# Patient Record
Sex: Female | Born: 1990 | Race: White | Hispanic: No | Marital: Single | Smoking: Never smoker
Health system: Southern US, Community
[De-identification: ages and names within clinical notes are randomized; demographics above are authoritative.]

## PROBLEM LIST (undated history)

## (undated) DIAGNOSIS — N200 Calculus of kidney: Secondary | ICD-10-CM

## (undated) DIAGNOSIS — G43909 Migraine, unspecified, not intractable, without status migrainosus: Secondary | ICD-10-CM

## (undated) DIAGNOSIS — I10 Essential (primary) hypertension: Secondary | ICD-10-CM

## (undated) HISTORY — PX: CHOLECYSTECTOMY: SHX55

## (undated) HISTORY — PX: HERNIA REPAIR: SHX51

## (undated) HISTORY — PX: TONSILLECTOMY: SUR1361

---

## 2018-04-17 ENCOUNTER — Other Ambulatory Visit: Payer: Self-pay

## 2018-04-17 ENCOUNTER — Emergency Department: Payer: Medicaid Other

## 2018-04-17 ENCOUNTER — Encounter: Payer: Self-pay | Admitting: Emergency Medicine

## 2018-04-17 ENCOUNTER — Emergency Department
Admission: EM | Admit: 2018-04-17 | Discharge: 2018-04-17 | Disposition: A | Discharge status: Home / Self Care | Payer: Medicaid Other | Attending: Emergency Medicine | Admitting: Emergency Medicine

## 2018-04-17 DIAGNOSIS — G43009 Migraine without aura, not intractable, without status migrainosus: Secondary | ICD-10-CM | POA: Diagnosis not present

## 2018-04-17 DIAGNOSIS — R51 Headache: Secondary | ICD-10-CM | POA: Diagnosis present

## 2018-04-17 HISTORY — DX: Migraine, unspecified, not intractable, without status migrainosus: G43.909

## 2018-04-17 HISTORY — DX: Calculus of kidney: N20.0

## 2018-04-17 LAB — POCT PREGNANCY, URINE: PREG TEST UR: NEGATIVE

## 2018-04-17 MED ORDER — KETOROLAC TROMETHAMINE 30 MG/ML IJ SOLN
30.0000 mg | Freq: Once | INTRAMUSCULAR | Status: AC
  Administered 2018-04-17: 30 mg via INTRAVENOUS
  Filled 2018-04-17: qty 1

## 2018-04-17 MED ORDER — SODIUM CHLORIDE 0.9 % IV BOLUS
1000.0000 mL | Freq: Once | INTRAVENOUS | Status: AC
  Administered 2018-04-17: 1000 mL via INTRAVENOUS

## 2018-04-17 MED ORDER — ONDANSETRON 4 MG PO TBDP
4.0000 mg | ORAL_TABLET | Freq: Once | ORAL | Status: AC
  Administered 2018-04-17: 4 mg via ORAL
  Filled 2018-04-17: qty 1

## 2018-04-17 MED ORDER — DIPHENHYDRAMINE HCL 50 MG/ML IJ SOLN
25.0000 mg | Freq: Once | INTRAMUSCULAR | Status: AC
  Administered 2018-04-17: 25 mg via INTRAVENOUS
  Filled 2018-04-17: qty 1

## 2018-04-17 MED ORDER — METOCLOPRAMIDE HCL 5 MG/ML IJ SOLN
10.0000 mg | Freq: Once | INTRAMUSCULAR | Status: AC
  Administered 2018-04-17: 10 mg via INTRAVENOUS
  Filled 2018-04-17: qty 2

## 2018-04-17 NOTE — ED Notes (Signed)
See triage note  Presents with migraine   States she has hx of same  Has been out of meds for same  Has had some n/v this am

## 2018-04-17 NOTE — ED Provider Notes (Signed)
[facility] Emergency Department Provider Note  ____________________________________________   First MD Initiated Contact with Patient 04/17/18 0935     (approximate)  I have reviewed the triage vital signs and the nursing notes.   HISTORY  Chief Complaint Migraine   HPI Kayla Cross is a 27 y.o. female presents to the ED with complaint of "migraine headache".  Patient states that she woke at 2 AM with a headache that was mostly frontal and posterior.  Patient states that this is similar to headaches that she has had in the past.  She complains of nausea with some vomiting.  She is taking an some over-the-counter medication without any relief of her headache.  She denies any visual changes or trauma to her head.  Patient states that she "looked in the mirror to see if she was having a stroke".  Patient has never been evaluated for migraines in the past.  Currently she rates her pain as 6 out of 10.  Past Medical History:  Diagnosis Date  . Kidney stones   . Migraine     There are no active problems to display for this patient.   Past Surgical History:  Procedure Laterality Date  . CHOLECYSTECTOMY    . HERNIA REPAIR    . TONSILLECTOMY      Prior to Admission medications   Not on File    Allergies Patient has no known allergies.  No family history on file.  Social History Social History   Tobacco Use  . Smoking status: Never Smoker  . Smokeless tobacco: Never Used  Substance Use Topics  . Alcohol use: Not on file  . Drug use: Not on file    Review of Systems Constitutional: No fever/chills Eyes: No visual changes. ENT: No sore throat. Cardiovascular: Denies chest pain. Respiratory: Denies shortness of breath. Gastrointestinal: No abdominal pain.  No nausea, no vomiting.  Genitourinary: Negative for dysuria. Musculoskeletal: Negative for back pain. Skin: Negative for rash. Neurological: Positive for headaches, negative for focal  weakness or numbness. ____________________________________________   PHYSICAL EXAM:  VITAL SIGNS: ED Triage Vitals  Enc Vitals Group     BP 04/17/18 0909 (!) 155/119     Pulse Rate 04/17/18 0909 88     Resp 04/17/18 0909 20     Temp 04/17/18 0909 98.4 F (36.9 C)     Temp Source 04/17/18 0909 Oral     SpO2 04/17/18 0909 100 %     Weight 04/17/18 0831 280 lb (127 kg)     Height 04/17/18 0910 5\' 4"  (1.626 m)     Head Circumference --      Peak Flow --      Pain Score 04/17/18 0830 6     Pain Loc --      Pain Edu? --      Excl. in GC? --    Constitutional: Alert and oriented. Well appearing and in no acute distress. Eyes: Conjunctivae are normal. PERRL. EOMI. minimal photophobia. Head: Atraumatic. Nose: No congestion/rhinnorhea.  Mouth/Throat: Mucous membranes are moist.  Oropharynx non-erythematous. Neck: No stridor.  No cervical tenderness on palpation of cervical spine posteriorly.  Range of motion is that restriction in all 4 planes. Hematological/Lymphatic/Immunilogical: No cervical lymphadenopathy. Cardiovascular: Normal rate, regular rhythm. Grossly normal heart sounds.  Good peripheral circulation. Respiratory: Normal respiratory effort.  No retractions. Lungs CTAB. Musculoskeletal: Moves upper and lower extremities without any difficulty and normal gait was noted.  Muscle strength bilaterally. Neurologic:  Normal speech and  language. No gross focal neurologic deficits are appreciated.  Cranial nerves II through XII grossly intact.  No gait instability. Skin:  Skin is warm, dry and intact. No rash noted. Psychiatric: Mood and affect are normal. Speech and behavior are normal.  ____________________________________________   LABS (all labs ordered are listed, but only abnormal results are displayed)  Labs Reviewed  POCT PREGNANCY, URINE  POC URINE PREG, ED   ____________________________________________  RADIOLOGY  Official radiology report(s): Ct Head Wo  Contrast  Result Date: 04/17/2018 CLINICAL DATA:  Headache for several hours EXAM: CT HEAD WITHOUT CONTRAST TECHNIQUE: Contiguous axial images were obtained from the base of the skull through the vertex without intravenous contrast. COMPARISON:  None. FINDINGS: Brain: The ventricles are normal in size and configuration. There is no intracranial mass, hemorrhage, extra-axial fluid collection, or midline shift. The brain parenchyma appears unremarkable. There is no evident acute infarct. Vascular: There is no hyperdense vessel. There is no evident vascular calcification. Skull: The bony calvarium appears intact. Sinuses/Orbits: Visualized paranasal sinuses are clear. Visualized orbits appear symmetric bilaterally. Other: Mastoid air cells are clear. IMPRESSION: Study within normal limits. Electronically Signed   By: Bretta Bang III M.D.   On: 04/17/2018 10:41    ____________________________________________   PROCEDURES  Procedure(s) performed: None  Procedures  Critical Care performed: No  ____________________________________________   INITIAL IMPRESSION / ASSESSMENT AND PLAN / ED COURSE  As part of my medical decision making, I reviewed the following data within the electronic MEDICAL RECORD NUMBER Notes from prior ED visits and [place] Controlled Substance Database  Patient presents to the ED with complaint of acute onset of frontal and posterior headache at 2 AM.  Patient describes the pain as "typical for her headaches.  She states she has not been seen or worked up for migraine headaches in the past.  Currently she is nauseous but with no vomiting.  She denies any injury to her head.  Neurologically patient is intact and CT was reassuring that there was no hemorrhage or tumor causing her headache.  Patient was given IV fluids along with Toradol 30 mg, Benadryl 25 mg, and Reglan IV with complete resolution of her headache.  Patient was discharged when family member  arrived. ____________________________________________   FINAL CLINICAL IMPRESSION(S) / ED DIAGNOSES  Final diagnoses:  Migraine without aura and without status migrainosus, not intractable     ED Discharge Orders    None       Note:  This document was prepared using Dragon voice recognition software and may include unintentional dictation errors.    Tommi Rumps, PA-C 04/17/18 1327    Arnaldo Natal, MD 04/17/18 [phone]

## 2018-04-17 NOTE — ED Triage Notes (Signed)
C/O headache since 0200.  Describes pain as typical migraine pain.  AAOx3.  Skin warm and dry.  MAE equally and strong.  NAD

## 2018-04-17 NOTE — Discharge Instructions (Addendum)
Call 1 the clinics listed on your discharge papers and establish a primary care provider.  Increase fluids today.  Continue with Tylenol or ibuprofen as needed for discomfort. Return to the emergency department if any severe worsening of your symptoms. Do not drive or operate machinery today as you have had medication that

## 2018-05-05 ENCOUNTER — Encounter: Payer: Self-pay | Admitting: Gynecology

## 2018-05-05 ENCOUNTER — Ambulatory Visit
Admission: EM | Admit: 2018-05-05 | Discharge: 2018-05-05 | Disposition: A | Discharge status: Home / Self Care | Payer: Medicaid Other | Attending: Family Medicine | Admitting: Family Medicine

## 2018-05-05 ENCOUNTER — Other Ambulatory Visit: Payer: Self-pay

## 2018-05-05 DIAGNOSIS — M545 Low back pain: Secondary | ICD-10-CM | POA: Insufficient documentation

## 2018-05-05 DIAGNOSIS — M5489 Other dorsalgia: Secondary | ICD-10-CM | POA: Diagnosis not present

## 2018-05-05 DIAGNOSIS — M549 Dorsalgia, unspecified: Secondary | ICD-10-CM

## 2018-05-05 DIAGNOSIS — N12 Tubulo-interstitial nephritis, not specified as acute or chronic: Secondary | ICD-10-CM | POA: Insufficient documentation

## 2018-05-05 HISTORY — DX: Essential (primary) hypertension: I10

## 2018-05-05 LAB — URINALYSIS, COMPLETE (UACMP) WITH MICROSCOPIC
Bilirubin Urine: NEGATIVE
GLUCOSE, UA: NEGATIVE mg/dL
KETONES UR: NEGATIVE mg/dL
NITRITE: NEGATIVE
PH: 6.5 (ref 5.0–8.0)
SPECIFIC GRAVITY, URINE: 1.025 (ref 1.005–1.030)

## 2018-05-05 MED ORDER — CEFTRIAXONE SODIUM 1 G IJ SOLR
1.0000 g | Freq: Once | INTRAMUSCULAR | Status: AC
  Administered 2018-05-05: 1 g via INTRAMUSCULAR

## 2018-05-05 MED ORDER — CEPHALEXIN 500 MG PO CAPS: 500.0000 mg | ORAL_CAPSULE | Freq: Two times a day (BID) | ORAL | 0 refills | Status: AC

## 2018-05-05 NOTE — ED Provider Notes (Signed)
[facility] ____________________________________________  Time seen: Approximately 3:06 PM  I have reviewed the triage vital signs and the nursing notes.   HISTORY  Chief Complaint No chief complaint on file.   HPI Kayla GoingKatie Cross is a 27 y.o. female presented for evaluation of low back pain present for the last 1 week.  Patient reports she has a history of recurrent low back pain also has a history of recurrent UTIs prompting her to come in.  States pain is worse with movement but also present at rest.  Denies any trauma.  States pain is worse in the left low back, and further expresses concern of previous pyelonephritis with similar sensation.  Patient also states that she has had previous kidney stones but none in the last 8 years and states current pain does not feel like previous stones.  States pain is currently moderate to left lower back.  States occasionally she felt like some pain wrapped around to her left abdomen, but not constant.  Patient states that she does not usually have urinary symptoms with her UTIs, and denies any current urinary frequency, urgency or burning with urination.  Denies accompanying fevers.  No nausea, vomiting or vaginal complaints.  Has continued to eat and drink well.  Denies trauma.  Denies chance of current pregnancy.  No chest pain or shortness of breath.  Reports otherwise doing well denies other complaints.  Patient's last menstrual period was 04/14/2018.IUD. Denies pregnancy   Past Medical History:  Diagnosis Date  . Hypertension    pre-clampsia  . Kidney stones   . Migraine     There are no active problems to display for this patient.   Past Surgical History:  Procedure Laterality Date  . CHOLECYSTECTOMY    . HERNIA REPAIR    . TONSILLECTOMY       No current facility-administered medications for this encounter.   Current Outpatient Medications:  .  cephALEXin (KEFLEX) 500 MG capsule, Take 1 capsule (500 mg total) by  mouth 2 (two) times daily for 10 days., Disp: 20 capsule, Rfl: 0  Allergies Patient has no known allergies.  Family History  Problem Relation Age of Onset  . Autoimmune disease Mother   . Hypertension Father     Social History Social History   Tobacco Use  . Smoking status: Never Smoker  . Smokeless tobacco: Never Used  Substance Use Topics  . Alcohol use: Never    Frequency: Never  . Drug use: Never    Review of Systems Constitutional: No fever Cardiovascular: Denies chest pain. Respiratory: Denies shortness of breath. Gastrointestinal: No nausea, no vomiting.  No diarrhea.   Genitourinary: Negative for dysuria. Musculoskeletal: positive for back pain. Skin: Negative for rash. Neurological: Negative for headaches, focal weakness or numbness.  ____________________________________________   PHYSICAL EXAM:  VITAL SIGNS: ED Triage Vitals  Enc Vitals Group     BP 05/05/18 1401 (!) 151/108     Pulse Rate 05/05/18 1357 76     Resp 05/05/18 1357 18     Temp 05/05/18 1357 98.9 F (37.2 C)     Temp Source 05/05/18 1357 Oral     SpO2 05/05/18 1357 99 %     Weight 05/05/18 1402 285 lb (129.3 kg)     Height --      Head Circumference --      Peak Flow --      Pain Score 05/05/18 1401 4     Pain Loc --  Pain Edu? --      Excl. in GC? --    Vitals:   05/05/18 1357 05/05/18 1401 05/05/18 1402  BP:  (!) 151/108 (!) 137/97  Pulse: 76    Resp: 18    Temp: 98.9 F (37.2 C)    TempSrc: Oral    SpO2: 99%    Weight:   285 lb (129.3 kg)     Constitutional: Alert and oriented. Well appearing and in no acute distress. ENT      Head: Normocephalic and atraumatic. Cardiovascular: Normal rate, regular rhythm. Grossly normal heart sounds.  Good peripheral circulation. Respiratory: Normal respiratory effort without tachypnea nor retractions. Breath sounds are clear and equal bilaterally. No wheezes, rales, rhonchi. Gastrointestinal: Normal Bowel sounds.  Minimal left  lower quadrant abdominal tenderness palpation.  No suprapubic tenderness.  Mild left CVA as well as left latissimus dorsi tenderness.  No right CVA tenderness. Musculoskeletal:  No midline cervical, thoracic or lumbar tenderness to palpation.  Changes positions quickly in room.  Steady gait. Neurologic:  Normal speech and language. Speech is normal. No gait instability.  Skin:  Skin is warm, dry and intact. No rash noted. Psychiatric: Mood and affect are normal. Speech and behavior are normal. Patient exhibits appropriate insight and judgment   ___________________________________________   LABS (all labs ordered are listed, but only abnormal results are displayed)  Labs Reviewed  URINALYSIS, COMPLETE (UACMP) WITH MICROSCOPIC - Abnormal; Notable for the following components:      Result Value   APPearance HAZY (*)    Hgb urine dipstick TRACE (*)    Protein, ur TRACE (*)    Leukocytes, UA TRACE (*)    Bacteria, UA MANY (*)    All other components within normal limits  URINE CULTURE     PROCEDURES Procedures    INITIAL IMPRESSION / ASSESSMENT AND PLAN / ED COURSE  Pertinent labs & imaging results that were available during my care of the patient were reviewed by me and considered in my medical decision making (see chart for details).  Overall well-appearing patient.  Patient with left-sided low back pain.  History of recurrent UTIs with pyelonephritis.  Urinalysis suggestive of UTI.,  Concern for pyelonephritis.  Discussed with patient doubt stone as patient states this does not feel similar to her previous soon as well as trace hemoglobin and negative RBCs.  Patient reports tolerates Keflex well.  1 g IM Rocephin given once in urgent care.  Will treat with 10-day course of Keflex and await urine culture.  Discussed very strict follow-up and return parameters.  Discussed for any increased pain, fever, inability to tolerate medicines or worsening complaints proceed directly to the  emergency room for imaging.  Rest, fluids, supportive care.Discussed indication, risks and benefits of medications with patient.  Discussed follow up with Primary care physician this week. Discussed follow up and return parameters including no resolution or any worsening concerns. Patient verbalized understanding and agreed to plan.   ____________________________________________   FINAL CLINICAL IMPRESSION(S) / ED DIAGNOSES  Final diagnoses:  Pyelonephritis  Acute left-sided back pain, unspecified back location     ED Discharge Orders         Ordered    cephALEXin (KEFLEX) 500 MG capsule  2 times daily     05/05/18 1447           Note: This dictation was prepared with Dragon dictation along with smaller phrase technology. Any transcriptional errors that result from this process are unintentional.  Renford Dills, NP 05/05/18 [phone]

## 2018-05-05 NOTE — ED Triage Notes (Signed)
Patient c/o lower back pain x week

## 2018-05-05 NOTE — Discharge Instructions (Addendum)
Take medication as prescribed. Rest. Drink plenty of fluids. Over the counter tylenol and ibuprofen as needed.   Follow up with your primary care physician this week as needed. Return to Urgent care as needed.  For any increased pain, fever or worsening concerns proceed directly to the emergency room.

## 2018-05-06 LAB — URINE CULTURE

## 2018-06-02 ENCOUNTER — Other Ambulatory Visit: Payer: Self-pay

## 2018-06-02 ENCOUNTER — Encounter: Payer: Self-pay | Admitting: Gynecology

## 2018-06-02 ENCOUNTER — Ambulatory Visit
Admission: EM | Admit: 2018-06-02 | Discharge: 2018-06-02 | Disposition: A | Discharge status: Home / Self Care | Payer: Medicaid Other | Attending: Family Medicine | Admitting: Family Medicine

## 2018-06-02 DIAGNOSIS — R399 Unspecified symptoms and signs involving the genitourinary system: Secondary | ICD-10-CM | POA: Insufficient documentation

## 2018-06-02 LAB — URINALYSIS, COMPLETE (UACMP) WITH MICROSCOPIC
Bilirubin Urine: NEGATIVE
GLUCOSE, UA: NEGATIVE mg/dL
Ketones, ur: NEGATIVE mg/dL
Leukocytes, UA: NEGATIVE
Nitrite: POSITIVE — AB
PROTEIN: 100 mg/dL — AB
Specific Gravity, Urine: 1.02 (ref 1.005–1.030)
pH: 7 (ref 5.0–8.0)

## 2018-06-02 MED ORDER — NITROFURANTOIN MONOHYD MACRO 100 MG PO CAPS: 100.0000 mg | ORAL_CAPSULE | Freq: Two times a day (BID) | ORAL | 0 refills | Status: DC

## 2018-06-02 MED ORDER — FLUCONAZOLE 150 MG PO TABS: 150.0000 mg | ORAL_TABLET | Freq: Every day | ORAL | 0 refills | Status: DC

## 2018-06-02 NOTE — ED Triage Notes (Signed)
Patient c/o frequent urination

## 2018-06-02 NOTE — Discharge Instructions (Signed)
Your urine is suggest of an infection.  I am placing you on antibiotics while awaiting the culture.  Take care  Dr. Adriana Simas

## 2018-06-02 NOTE — ED Provider Notes (Signed)
[facility]    CSN: [id number] Arrival date & time: 06/02/18  1007   History   Chief Complaint Concern for UTI  HPI  28 year old female with reported history of recurrent/frequent UTIs presents with urinary symptoms.  Patient was seen on 12/8 for urinary symptoms.  She was treated empirically.  Urine culture grew out multiple bacteria.  Patient states that her back pain resolved after treatment but her urinary symptoms have persisted.  Patient reports urinary frequency and feelings as if she is not emptying her bladder fully.  She reports some lower abdominal pain/pelvic pain.  Denies dysuria.  No fever.  No current back pain or flank pain.  No other reported symptoms.  No other complaints.  PMH, Surgical Hx, Family Hx, Social History reviewed and updated as below.  Past Medical History:  Diagnosis Date  . Hypertension    pre-clampsia  . Kidney stones   . Migraine    Past Surgical History:  Procedure Laterality Date  . CHOLECYSTECTOMY    . HERNIA REPAIR    . TONSILLECTOMY      OB History   No obstetric history on file.      Home Medications    Prior to Admission medications   Medication Sig Start Date End Date Taking? Authorizing Provider  nitrofurantoin, macrocrystal-monohydrate, (MACROBID) 100 MG capsule Take 1 capsule (100 mg total) by mouth 2 (two) times daily. 06/02/18   Tommie Sams, DO    Family History Family History  Problem Relation Age of Onset  . Autoimmune disease Mother   . Hypertension Father     Social History Social History   Tobacco Use  . Smoking status: Never Smoker  . Smokeless tobacco: Never Used  Substance Use Topics  . Alcohol use: Never    Frequency: Never  . Drug use: Never     Allergies   Patient has no known allergies.   Review of Systems Review of Systems  Constitutional: Negative for fever.  Genitourinary: Positive for frequency. Negative for dysuria and flank pain.   Physical Exam Triage Vital  Signs ED Triage Vitals  Enc Vitals Group     BP 06/02/18 1035 (!) 143/94     Pulse Rate 06/02/18 1035 78     Resp 06/02/18 1035 18     Temp 06/02/18 1035 97.9 F (36.6 C)     Temp Source 06/02/18 1035 Oral     SpO2 06/02/18 1035 99 %     Weight 06/02/18 1037 300 lb (136.1 kg)     Height 06/02/18 1037 5\' 4"  (1.626 m)     Head Circumference --      Peak Flow --      Pain Score 06/02/18 1037 0     Pain Loc --      Pain Edu? --      Excl. in GC? --    Updated Vital Signs BP (!) 143/94 (BP Location: Left Arm)   Pulse 78   Temp 97.9 F (36.6 C) (Oral)   Resp 18   Ht 5\' 4"  (1.626 m)   Wt 136.1 kg   LMP 05/12/2018   SpO2 99%   BMI 51.49 kg/m   Visual Acuity Right Eye Distance:   Left Eye Distance:   Bilateral Distance:    Right Eye Near:   Left Eye Near:    Bilateral Near:     Physical Exam Vitals signs and nursing note reviewed.  Constitutional:      General: She is not  in acute distress.    Appearance: She is obese.  HENT:     Head: Normocephalic and atraumatic.  Cardiovascular:     Rate and Rhythm: Normal rate and regular rhythm.  Pulmonary:     Effort: Pulmonary effort is normal. No respiratory distress.  Abdominal:     General: There is no distension.     Palpations: Abdomen is soft.     Tenderness: There is no abdominal tenderness.  Neurological:     Mental Status: She is alert.  Psychiatric:        Mood and Affect: Mood normal.        Behavior: Behavior normal.    UC Treatments / Results  Labs (all labs ordered are listed, but only abnormal results are displayed) Labs Reviewed  URINALYSIS, COMPLETE (UACMP) WITH MICROSCOPIC - Abnormal; Notable for the following components:      Result Value   APPearance HAZY (*)    Hgb urine dipstick TRACE (*)    Protein, ur 100 (*)    Nitrite POSITIVE (*)    Bacteria, UA MANY (*)    All other components within normal limits  URINE CULTURE    EKG None  Radiology No results  found.  Procedures Procedures (including critical care time)  Medications Ordered in UC Medications - No data to display  Initial Impression / Assessment and Plan / UC Course  I have reviewed the triage vital signs and the nursing notes.  Pertinent labs & imaging results that were available during my care of the patient were reviewed by me and considered in my medical decision making (see chart for details).    28 year old female presents with urinary symptoms.  Analysis suggestive of UTI. Sending culture.  Placing on Macrobid while awaiting culture.  Final Clinical Impressions(s) / UC Diagnoses   Final diagnoses:  Urinary tract infection symptoms     Discharge Instructions     Your urine is suggest of an infection.  I am placing you on antibiotics while awaiting the culture.  Take care  Dr. Adriana Simasook    ED Prescriptions    Medication Sig Dispense Auth. Provider   nitrofurantoin, macrocrystal-monohydrate, (MACROBID) 100 MG capsule Take 1 capsule (100 mg total) by mouth 2 (two) times daily. 14 capsule Tommie Samsook, Kamirah Shugrue G, DO     Controlled Substance Prescriptions [place] Controlled Substance Registry consulted? Not Applicable   Tommie SamsCook, Shaketa Serafin G, DO 06/02/18 1126

## 2018-06-05 ENCOUNTER — Telehealth (HOSPITAL_COMMUNITY): Payer: Self-pay | Admitting: Emergency Medicine

## 2018-06-05 LAB — URINE CULTURE

## 2018-06-05 NOTE — Telephone Encounter (Signed)
Urine culture was positive for E coli and was given MACROBID  at urgent care visit. Attempted to reach patient. No answer at this time.

## 2018-12-07 ENCOUNTER — Emergency Department
Admission: EM | Admit: 2018-12-07 | Discharge: 2018-12-07 | Disposition: A | Discharge status: Home / Self Care | Payer: Medicaid Other | Attending: Emergency Medicine | Admitting: Emergency Medicine

## 2018-12-07 ENCOUNTER — Other Ambulatory Visit: Payer: Self-pay

## 2018-12-07 ENCOUNTER — Encounter: Payer: Self-pay | Admitting: Emergency Medicine

## 2018-12-07 DIAGNOSIS — Z79899 Other long term (current) drug therapy: Secondary | ICD-10-CM | POA: Insufficient documentation

## 2018-12-07 DIAGNOSIS — Z9049 Acquired absence of other specified parts of digestive tract: Secondary | ICD-10-CM | POA: Insufficient documentation

## 2018-12-07 DIAGNOSIS — H109 Unspecified conjunctivitis: Secondary | ICD-10-CM

## 2018-12-07 DIAGNOSIS — H1032 Unspecified acute conjunctivitis, left eye: Secondary | ICD-10-CM | POA: Insufficient documentation

## 2018-12-07 MED ORDER — GENTAMICIN SULFATE 0.3 % OP SOLN: 2.0000 [drp] | Freq: Four times a day (QID) | OPHTHALMIC | 0 refills | Status: AC

## 2018-12-07 MED ORDER — FLUORESCEIN SODIUM 1 MG OP STRP
1.0000 | ORAL_STRIP | Freq: Once | OPHTHALMIC | Status: AC
  Administered 2018-12-07: 1 via OPHTHALMIC
  Filled 2018-12-07: qty 1

## 2018-12-07 MED ORDER — TETRACAINE HCL 0.5 % OP SOLN
2.0000 [drp] | Freq: Once | OPHTHALMIC | Status: AC
  Administered 2018-12-07: 2 [drp] via OPHTHALMIC
  Filled 2018-12-07: qty 4

## 2018-12-07 MED ORDER — GENTAMICIN SULFATE 0.3 % OP SOLN: 2.0000 [drp] | Freq: Four times a day (QID) | OPHTHALMIC | 0 refills | Status: DC

## 2018-12-07 NOTE — Discharge Instructions (Signed)
Follow-up with Dr. Neville Route who is on-call for [facility] if any continued problems.  Begin using eyedrops to your left eye 2 drops 4 times a day until your eyes completely better.  Avoid wearing contacts or using eye make-up until this is completely cleared.  You may also take Tylenol if needed for pain.

## 2018-12-07 NOTE — ED Provider Notes (Signed)
[facility] Emergency Department Provider Note  ____________________________________________   First MD Initiated Contact with Patient 12/07/18 1749     (approximate)  I have reviewed the triage vital signs and the nursing notes.   HISTORY  Chief Complaint Eye Problem   HPI Kayla Cross is a 28 y.o. female presents to the ED with complaint of eye irritation to her left eye for the last 4 days.  Patient thinks she might be having a reaction to magnetic lashes that she used 4 days ago.  She states that there is intermittent photophobia and that she has used over-the-counter drops and her contact solution to irrigate her eye without relief.  She rates her pain as a 4 out of 10.     Past Medical History:  Diagnosis Date  . Kidney stones   . Migraine     There are no active problems to display for this patient.   Past Surgical History:  Procedure Laterality Date  . CHOLECYSTECTOMY    . HERNIA REPAIR    . TONSILLECTOMY      Prior to Admission medications   Medication Sig Start Date End Date Taking? Authorizing Provider  fluconazole (DIFLUCAN) 150 MG tablet Take 1 tablet (150 mg total) by mouth daily. Repeat dose in 72 hours. 06/02/18   Tommie Samsook, Jayce G, DO  gentamicin (GARAMYCIN) 0.3 % ophthalmic solution Place 2 drops into the left eye 4 (four) times daily for 10 days. 12/07/18 12/17/18  Tommi RumpsSummers,  L, PA-C  nitrofurantoin, macrocrystal-monohydrate, (MACROBID) 100 MG capsule Take 1 capsule (100 mg total) by mouth 2 (two) times daily. 06/02/18   Tommie Samsook, Jayce G, DO    Allergies Patient has no known allergies.  Family History  Problem Relation Age of Onset  . Autoimmune disease Mother   . Hypertension Father     Social History Social History   Tobacco Use  . Smoking status: Never Smoker  . Smokeless tobacco: Never Used  Substance Use Topics  . Alcohol use: Never    Frequency: Never  . Drug use: Never    Review of Systems Constitutional: No  fever/chills Eyes: Left eye pain. Cardiovascular: Denies chest pain. Respiratory: Denies shortness of breath. Musculoskeletal: Negative for muscle aches. Skin: Negative for rash. Neurological: Negative for headaches  ___________________________________________   PHYSICAL EXAM:  VITAL SIGNS: ED Triage Vitals  Enc Vitals Group     BP 12/07/18 1722 (!) 162/100     Pulse Rate 12/07/18 1721 85     Resp 12/07/18 1721 18     Temp 12/07/18 1721 97.9 F (36.6 C)     Temp Source 12/07/18 1721 Oral     SpO2 12/07/18 1721 99 %     Weight 12/07/18 1721 295 lb (133.8 kg)     Height 12/07/18 1721 5\' 4"  (1.626 m)     Head Circumference --      Peak Flow --      Pain Score 12/07/18 1721 4     Pain Loc --      Pain Edu? --      Excl. in GC? --     Constitutional: Alert and oriented. Well appearing and in no acute distress. Eyes: Conjunctivae are normal on the right.  Left conjunctive is injected without exudate.  PERRL. EOMI. tetracaine was placed in the left eye.  Upper lid was everted and no foreign body was noted.  Fluorescein was placed in the eye and there was no corneal abrasions seen. Head: Atraumatic. Nose:  No congestion/rhinnorhea. Neck: No stridor.   Cardiovascular: Normal rate, regular rhythm. Grossly normal heart sounds.  Good peripheral circulation. Respiratory: Normal respiratory effort.  No retractions. Lungs CTAB. Musculoskeletal: Moves upper and lower extremities that any difficulty.  Normal gait was noted. Neurologic:  Normal speech and language. No gross focal neurologic deficits are appreciated. No gait instability. Skin:  Skin is warm, dry and intact. No rash noted. Psychiatric: Mood and affect are normal. Speech and behavior are normal.  ____________________________________________   LABS (all labs ordered are listed, but only abnormal results are displayed)  Labs Reviewed - No data to display   PROCEDURES  Procedure(s) performed (including Critical Care):   Procedures   ____________________________________________   INITIAL IMPRESSION / ASSESSMENT AND PLAN / ED COURSE  As part of my medical decision making, I reviewed the following data within the electronic MEDICAL RECORD NUMBER Notes from prior ED visits and [place] Controlled Substance Database  28 year old female presents to the ED with complaint of left eye irritation after using a set of magnetic lashes 4 days ago.  Patient has been using over-the-counter eyedrops and contact solution without any relief.  Visual acuity was noted.  Patient exam was negative for corneal abrasion but irritation to the conjunctivo-is noted.  Patient was discharged with prescription for gentamicin ophthalmic solution.  She is to also follow-up with [facility] if any continued problems. ____________________________________________   FINAL CLINICAL IMPRESSION(S) / ED DIAGNOSES  Final diagnoses:  Conjunctivitis of left eye, unspecified conjunctivitis type     ED Discharge Orders         Ordered    gentamicin (GARAMYCIN) 0.3 % ophthalmic solution  4 times daily,   Status:  Discontinued     12/07/18 1806    gentamicin (GARAMYCIN) 0.3 % ophthalmic solution  4 times daily     12/07/18 1905           Note:  This document was prepared using Dragon voice recognition software and may include unintentional dictation errors.    Johnn Hai, PA-C 12/07/18 2005    Earleen Newport, MD 12/07/18 2041

## 2018-12-07 NOTE — ED Triage Notes (Signed)
Pt here for pain/redness to left eye. Thinks having reaction to magnetic lashes.

## 2019-08-27 ENCOUNTER — Encounter: Payer: Self-pay | Admitting: Advanced Practice Midwife

## 2019-08-27 ENCOUNTER — Ambulatory Visit (LOCAL_COMMUNITY_HEALTH_CENTER): Payer: Self-pay | Admitting: Advanced Practice Midwife

## 2019-08-27 ENCOUNTER — Other Ambulatory Visit: Payer: Self-pay

## 2019-08-27 VITALS — BP 158/101 | Ht 65.0 in | Wt 320.8 lb

## 2019-08-27 DIAGNOSIS — E669 Obesity, unspecified: Secondary | ICD-10-CM | POA: Insufficient documentation

## 2019-08-27 DIAGNOSIS — Z30432 Encounter for removal of intrauterine contraceptive device: Secondary | ICD-10-CM

## 2019-08-27 DIAGNOSIS — Z3009 Encounter for other general counseling and advice on contraception: Secondary | ICD-10-CM

## 2019-08-27 NOTE — Progress Notes (Signed)
   [facility]  Subjective:  Kayla Cross is a 29 y.o. SWF nonsmoker G4P4 being seen today for IUD removal, pap, hx preeclampsia, swollen feet intermittently x 1 mo with sore swollen calves since yesterday  Chief Complaint  Patient presents with  . Procedure    IUD removal    HPI  LMP 08/08/19.  Last sex 08/21/19 with condom.  Liletta inserted pp 08/2017 in [place], [place].  Hx preeclampsia with last pregnancy 2019 with no f/u. 158/101. Repeat 157/104.  Last pap 2014? No record Does the patient have a current or past history of drug use? No   No components found for: HCV]   Health Maintenance Due  Topic Date Due  . HIV Screening  Never done  . TETANUS/TDAP  Never done  . PAP-Cervical Cytology Screening  Never done  . PAP SMEAR-Modifier  Never done  . INFLUENZA VACCINE  Never done    ROS  The following portions of the patient's history were reviewed and updated as appropriate: allergies, current medications, past family history, past medical history, past social history, past surgical history and problem list. Problem list updated.   See flowsheet for other program required questions.  Objective:   Vitals:   08/27/19 1546  BP: (!) 158/101  Weight: (!) 320 lb 12.8 oz (145.5 kg)  Height: 5\' 5"  (1.651 m)    Physical Exam Vitals reviewed.  Constitutional:      Appearance: She is obese.  HENT:     Head: Normocephalic.  Abdominal:     Palpations: Abdomen is soft.     Comments: Poor tone, soft without tenderness, increased adipose  Genitourinary:    General: Normal vulva.     Exam position: Lithotomy position.     Vagina: Normal.     Cervix: Friability (to pap and cultures) present.     Rectum: Normal.     Comments: IUD easily removed and shown to pt Skin:    General: Skin is warm and dry.  Neurological:     Mental Status: She is alert.     Calves equal bilaterally with no masses or erythema  palpated. Friable cx to pap  Assessment and Plan:  Kayla Cross is a 29 y.o. female presenting to the [facility] Department for a Women's Health problem visit  1. Obesity, unspecified classification, unspecified obesity type, unspecified whether serious comorbidity present   2. Family planning Please give primary care MD info to pt; referred to primary care MD for elevated BP. Declines birth control today stating partner is getting vasectomy - Chlamydia/Gonorrhea  Lab - IGP, Aptima HPV  3. Encounter for removal of intrauterine contraceptive device (IUD) Liletta easily removed and shown to pt     Return if symptoms worsen or fail to improve.  No future appointments.  [facility], CNM

## 2019-08-27 NOTE — Progress Notes (Addendum)
Here today for IUD removal. No previous PE or Pap Smears at this agency. Patient reports last Pap Smear in 2014 doesn't remember where. Reports IUD inserted in 2019 in [place], [place] after the birth of her son. Tawny Hopping, RN

## 2019-08-29 ENCOUNTER — Emergency Department: Payer: Self-pay

## 2019-08-29 ENCOUNTER — Other Ambulatory Visit: Payer: Self-pay

## 2019-08-29 ENCOUNTER — Emergency Department
Admission: EM | Admit: 2019-08-29 | Discharge: 2019-08-29 | Disposition: A | Discharge status: Home / Self Care | Payer: Self-pay | Attending: Emergency Medicine | Admitting: Emergency Medicine

## 2019-08-29 DIAGNOSIS — R2243 Localized swelling, mass and lump, lower limb, bilateral: Secondary | ICD-10-CM | POA: Insufficient documentation

## 2019-08-29 DIAGNOSIS — I10 Essential (primary) hypertension: Secondary | ICD-10-CM | POA: Insufficient documentation

## 2019-08-29 DIAGNOSIS — R0602 Shortness of breath: Secondary | ICD-10-CM | POA: Insufficient documentation

## 2019-08-29 DIAGNOSIS — M79672 Pain in left foot: Secondary | ICD-10-CM | POA: Insufficient documentation

## 2019-08-29 LAB — COMPREHENSIVE METABOLIC PANEL
ALT: 17 U/L (ref 0–44)
AST: 25 U/L (ref 15–41)
Albumin: 4.1 g/dL (ref 3.5–5.0)
Alkaline Phosphatase: 63 U/L (ref 38–126)
Anion gap: 10 (ref 5–15)
BUN: 13 mg/dL (ref 6–20)
CO2: 20 mmol/L — ABNORMAL LOW (ref 22–32)
Calcium: 9.2 mg/dL (ref 8.9–10.3)
Chloride: 106 mmol/L (ref 98–111)
Creatinine, Ser: 0.7 mg/dL (ref 0.44–1.00)
GFR calc Af Amer: >60 mL/min (ref >60)
GFR calc non Af Amer: >60 mL/min (ref >60)
Glucose, Bld: 100 mg/dL — ABNORMAL HIGH (ref 70–99)
Potassium: 4.9 mmol/L (ref 3.5–5.1)
Sodium: 136 mmol/L (ref 135–145)
Total Bilirubin: 1.2 mg/dL (ref 0.3–1.2)
Total Protein: 8.2 g/dL — ABNORMAL HIGH (ref 6.5–8.1)

## 2019-08-29 LAB — CBC
HCT: 46.4 % — ABNORMAL HIGH (ref 36.0–46.0)
Hemoglobin: 14.6 g/dL (ref 12.0–15.0)
MCH: 30 pg (ref 26.0–34.0)
MCHC: 31.5 g/dL (ref 30.0–36.0)
MCV: 95.3 fL (ref 80.0–100.0)
Platelets: 151 10*3/uL (ref 150–400)
RBC: 4.87 MIL/uL (ref 3.87–5.11)
RDW: 11.8 % (ref 11.5–15.5)
WBC: 8.3 10*3/uL (ref 4.0–10.5)
nRBC: 0 % (ref 0.0–0.2)

## 2019-08-29 LAB — TROPONIN I (HIGH SENSITIVITY): Troponin I (High Sensitivity): 3 ng/L (ref <18)

## 2019-08-29 LAB — LIPASE, BLOOD: Lipase: 24 U/L (ref 11–51)

## 2019-08-29 LAB — BRAIN NATRIURETIC PEPTIDE: B Natriuretic Peptide: 84 pg/mL (ref 0.0–100.0)

## 2019-08-29 MED ORDER — SODIUM CHLORIDE 0.9% FLUSH: 3.0000 mL | Freq: Once | INTRAVENOUS | Status: DC

## 2019-08-29 MED ORDER — HYDROCHLOROTHIAZIDE 25 MG PO TABS: 25.0000 mg | ORAL_TABLET | Freq: Every day | ORAL | 1 refills | Status: AC

## 2019-08-29 NOTE — ED Triage Notes (Signed)
Pt comes via POV from home with c/o headaches and high blood pressure. Pt states the last few days she noticed swelling in calves and feet.  Pt states she has hx of HTN during pregnancy in the past. Pt denies any medications for BP

## 2019-08-29 NOTE — ED Notes (Signed)
Pt states having a high blood pressure for the last few days and swelling in her feet. Pt states she had pre-eclampsia with her last pregnancy, but that she hasn't had issues yet. Denies blurry vision and headaches.

## 2019-08-29 NOTE — Discharge Instructions (Signed)
Take the blood pressure medication as prescribed.  Follow-up with a primary care doctor or clinic.  Return to the ER for new, worsening, or persistent severe elevated blood pressure, swelling, chest pain, shortness of breath, or any other new or worsening symptoms that concern you.

## 2019-08-29 NOTE — ED Notes (Signed)
Pt transported to xray 

## 2019-08-29 NOTE — ED Provider Notes (Signed)
[facility] Emergency Department Provider Note ____________________________________________   First MD Initiated Contact with Patient 08/29/19 1213     (approximate)  I have reviewed the triage vital signs and the nursing notes.   HISTORY  Chief Complaint Leg Swelling and Hypertension    HPI Kayla Cross is a 29 y.o. female with PMH as noted below as well as a history of gestational hypertension who presents with elevated blood pressure, first noted 2 days ago when she was at an appointment getting an IUD removed.  It was noted to be 158/101 at that time.  She states that she was hypertensive during her most recent pregnancy 2 years ago and after the pregnancy had severe hypertension requiring treatment with magnesium.  She was then put on blood pressure medication for about 4 months but when her insurance ended she did not continue.  In addition, the patient reports gradual onset bilateral lower leg swelling over the last few months and has some exertional shortness of breath.  She denies any chest pain.  She also reports some subacute pain to the plantar area of her left foot but has had no trauma.  Past Medical History:  Diagnosis Date  . Kidney stones   . Migraine     Patient Active Problem List   Diagnosis Date Noted  . Obesity BMI=53.3 08/27/2019    Past Surgical History:  Procedure Laterality Date  . CHOLECYSTECTOMY    . HERNIA REPAIR    . TONSILLECTOMY      Prior to Admission medications   Medication Sig Start Date End Date Taking? Authorizing Provider  hydrochlorothiazide (HYDRODIURIL) 25 MG tablet Take 1 tablet (25 mg total) by mouth daily. 08/29/19 10/28/19  Dionne Bucy, MD    Allergies Patient has no known allergies.  Family History  Problem Relation Age of Onset  . Autoimmune disease Mother   . Hypertension Father     Social History Social History   Tobacco Use  . Smoking status: Never Smoker  . Smokeless tobacco:  Never Used  Substance Use Topics  . Alcohol use: Never  . Drug use: Never    Review of Systems  Constitutional: No fever. Eyes: No redness. ENT: No sore throat. Cardiovascular: Denies chest pain. Respiratory: Positive for intermittent shortness of breath. Gastrointestinal: No vomiting or diarrhea.  Genitourinary: Negative for dysuria.  Musculoskeletal: Negative for back pain. Skin: Negative for rash. Neurological: Negative for headaches, focal weakness or numbness.   ____________________________________________   PHYSICAL EXAM:  VITAL SIGNS: ED Triage Vitals [08/29/19 1111]  Enc Vitals Group     BP (!) 164/119     Pulse Rate 86     Resp 19     Temp 98.4 F (36.9 C)     Temp src      SpO2 95 %     Weight (!) 320 lb 12.8 oz (145.5 kg)     Height 5\' 4"  (1.626 m)     Head Circumference      Peak Flow      Pain Score 0     Pain Loc      Pain Edu?      Excl. in GC?     Constitutional: Alert and oriented.  Relatively well appearing and in no acute distress. Eyes: Conjunctivae are normal.  Head: Atraumatic. Nose: No congestion/rhinnorhea. Mouth/Throat: Mucous membranes are moist.   Neck: Normal range of motion.  Cardiovascular: Normal rate, regular rhythm. Grossly normal heart sounds.  Good peripheral circulation. Respiratory:  Normal respiratory effort.  No retractions. Lungs CTAB. Gastrointestinal: No distention.  Musculoskeletal: Trace bilateral lower extremity edema.  Extremities warm and well perfused.  Mild tenderness to the soft tissue of the lateral plantar surface of the left foot. Neurologic:  Normal speech and language. No gross focal neurologic deficits are appreciated.  Skin:  Skin is warm and dry. No rash noted. Psychiatric: Mood and affect are normal. Speech and behavior are normal.  ____________________________________________   LABS (all labs ordered are listed, but only abnormal results are displayed)  Labs Reviewed  COMPREHENSIVE METABOLIC  PANEL - Abnormal; Notable for the following components:      Result Value   CO2 20 (*)    Glucose, Bld 100 (*)    Total Protein 8.2 (*)    All other components within normal limits  CBC - Abnormal; Notable for the following components:   HCT 46.4 (*)    All other components within normal limits  LIPASE, BLOOD  BRAIN NATRIURETIC PEPTIDE  URINALYSIS, COMPLETE (UACMP) WITH MICROSCOPIC  BRAIN NATRIURETIC PEPTIDE  POC URINE PREG, ED  TROPONIN I (HIGH SENSITIVITY)  TROPONIN I (HIGH SENSITIVITY)   ____________________________________________  EKG  ED ECG REPORT I, Dionne Bucy, the attending physician, personally viewed and interpreted this ECG.  Date: 08/29/2019 EKG Time: 1509 Rate: 63 Rhythm: normal sinus rhythm QRS Axis: normal Intervals: normal ST/T Wave abnormalities: normal Narrative Interpretation: no evidence of acute ischemia  ____________________________________________  RADIOLOGY  CXR: No focal infiltrate no focal infiltrate or edema XR L foot: No fracture  ____________________________________________   PROCEDURES  Procedure(s) performed: No  Procedures  Critical Care performed: No ____________________________________________   INITIAL IMPRESSION / ASSESSMENT AND PLAN / ED COURSE  Pertinent labs & imaging results that were available during my care of the patient were reviewed by me and considered in my medical decision making (see chart for details).  29 year old female with PMH as noted above presents with hypertension as well as subacute to chronic bilateral lower leg swelling, mild shortness of breath, and pain to the plantar aspect of her left foot.  She was previously on an antihypertensive but discontinued over a year ago.  On exam she is well-appearing and her vital signs are normal except for hypertension.  She does have trace edema to the lower extremities and mild tenderness to the plantar aspect of the foot, but no other significant  exam findings.  Overall I suspect essential hypertension.  Given the patient's reported swelling and intermittent shortness of breath we will obtain a chest x-ray, troponin, and BNP to rule out new onset CHF or other cardiac etiology.  I suspect plantar fasciitis in the left foot but will obtain x-ray to rule out stress fracture or other bony etiology.  I anticipate discharge home starting the patient on antihypertensive if the work-up is reassuring.  ----------------------------------------- 4:00 PM on 08/29/2019 -----------------------------------------  Chest and foot x-rays are negative.  The additional labs are unremarkable.  The patient's BNP and troponin are both normal.  There is no indication for repeat troponin given the duration of the symptoms.  Her blood pressure remains somewhat elevated although she is asymptomatic at this time.  EKG shows no ischemic findings (the initial EKG obtained had a poor quality baseline due to poor adhesion of the leads and shows some artifact in the inferior leads.  The machine read this as acute MI although this is a spurious finding due to artifact.)  At this time, the patient is stable for discharge home.  I will start her on hydrochlorothiazide provisionally until she can follow-up with a PMD.  This should also help with some diuresis.  I discussed the results of the work-up.  Return precautions given, and she expresses understanding. ____________________________________________   FINAL CLINICAL IMPRESSION(S) / ED DIAGNOSES  Final diagnoses:  Hypertension, unspecified type      NEW MEDICATIONS STARTED DURING THIS VISIT:  Discharge Medication List as of 08/29/2019  3:59 PM    START taking these medications   Details  hydrochlorothiazide (HYDRODIURIL) 25 MG tablet Take 1 tablet (25 mg total) by mouth daily., Starting Fri 08/29/2019, Until Tue 10/28/2019, Normal         Note:  This document was prepared using Dragon voice recognition software  and may include unintentional dictation errors.    Arta Silence, MD 08/29/19 [phone]

## 2019-08-29 NOTE — ED Triage Notes (Addendum)
FIRST NURSE NOTE- here for swelling to feet and calf bilateral for 2 months. NAD. Unlabored.

## 2019-08-30 LAB — IGP, RFX APTIMA HPV ASCU: PAP Smear Comment: 0

## 2019-10-21 IMAGING — CT CT HEAD W/O CM
3 series · 15 of 47 positions shown, 18 images · non-contrast
Comparison: None.

CLINICAL DATA: Headache for several hours

EXAM:
CT HEAD WITHOUT CONTRAST
TECHNIQUE: Contiguous axial images were obtained from the base of the skull
through the vertex without intravenous contrast.

[Series 2: head wo · axial · 0.47mm/px · z∈[-134,-9]mm · 9 of 30 slices shown, 12 images]
[im 3/30  brain]
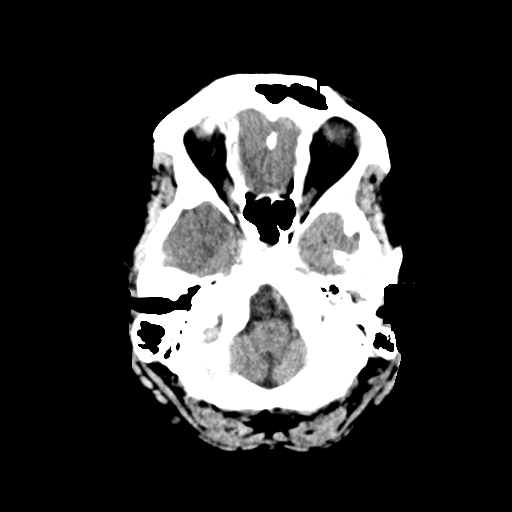
[im 3/30  bone]
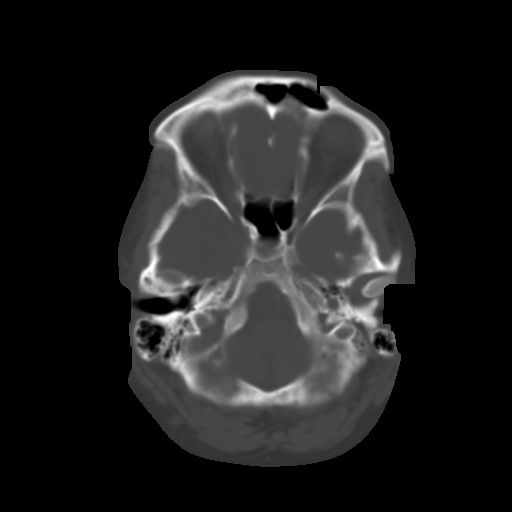
[im 6/30  brain]
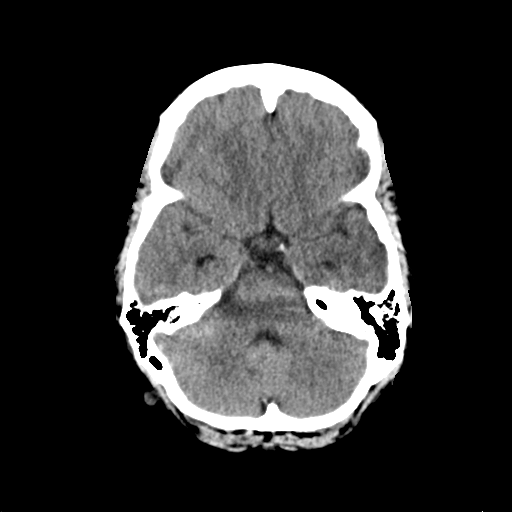
[im 9/30  brain]
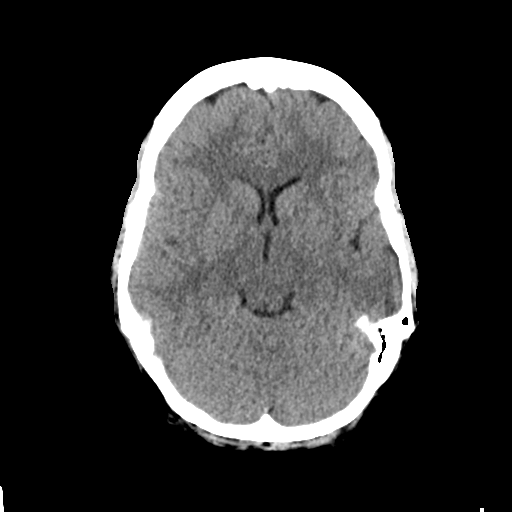
[im 12/30  brain]
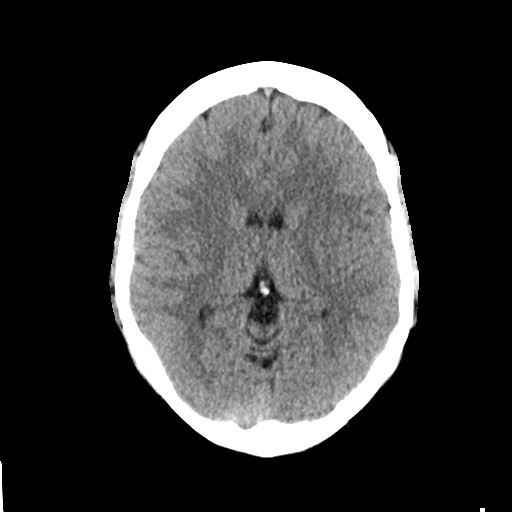
[im 16/30  brain]
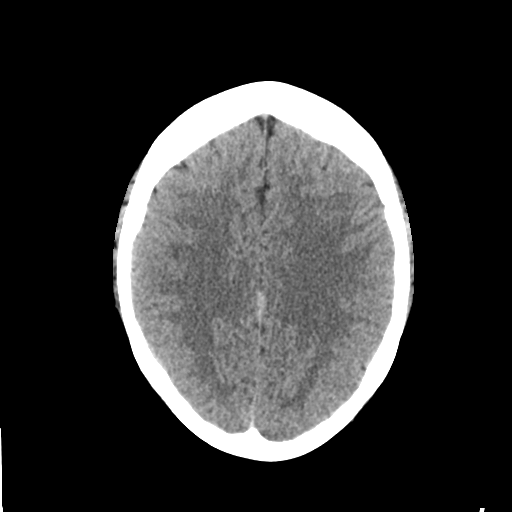
[im 16/30  bone]
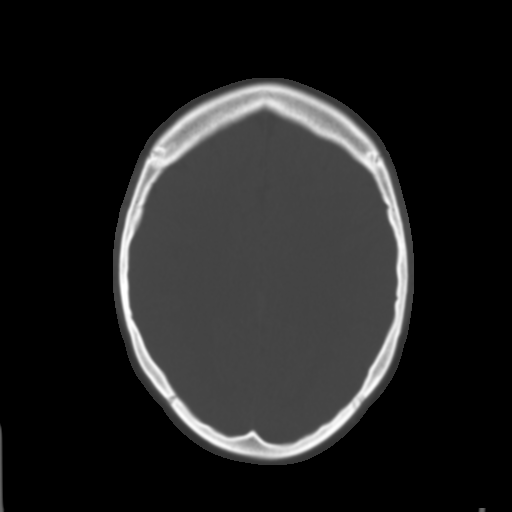
[im 19/30  brain]
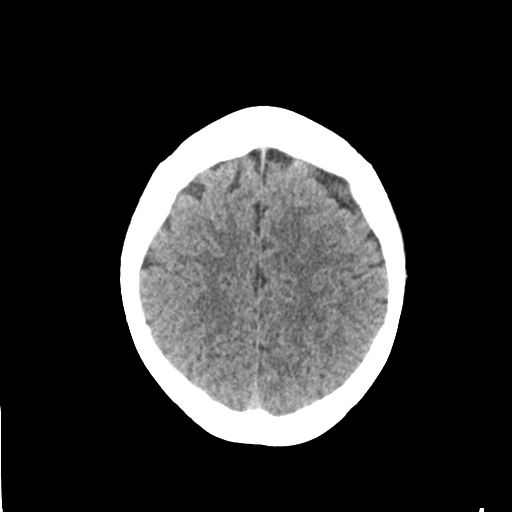
[im 22/30  brain]
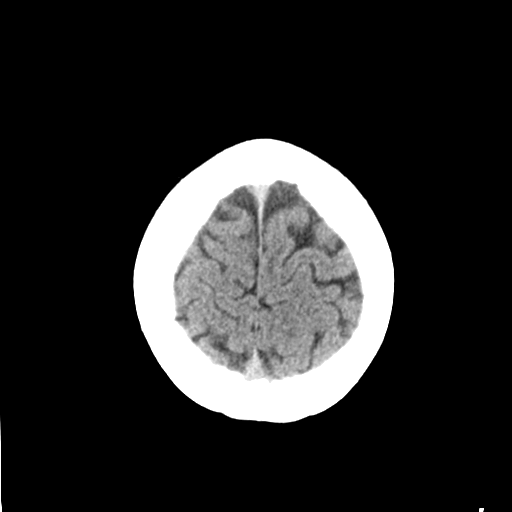
[im 25/30  brain]
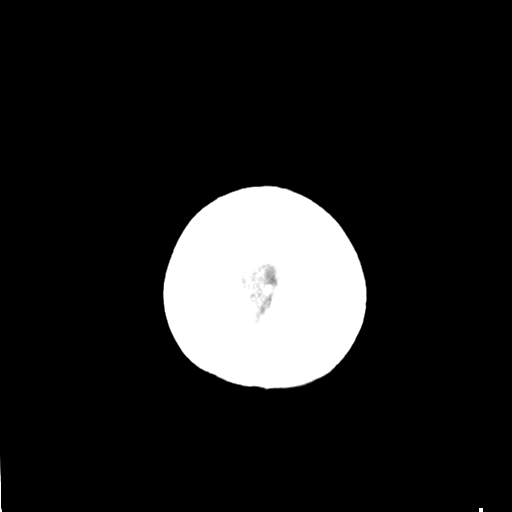
[im 28/30  brain]
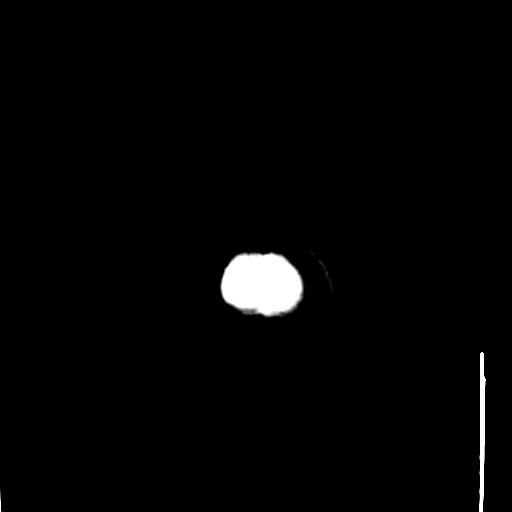
[im 28/30  bone]
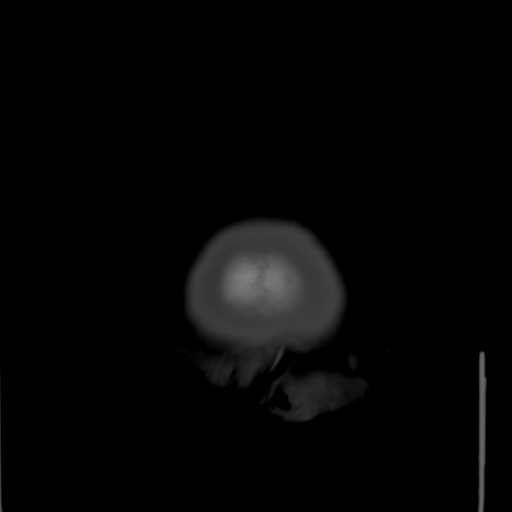

[Series 4: coronal soft tissue · coronal · 0.30mm/px · 3 of 66 slices shown]
[im 22/66  brain]
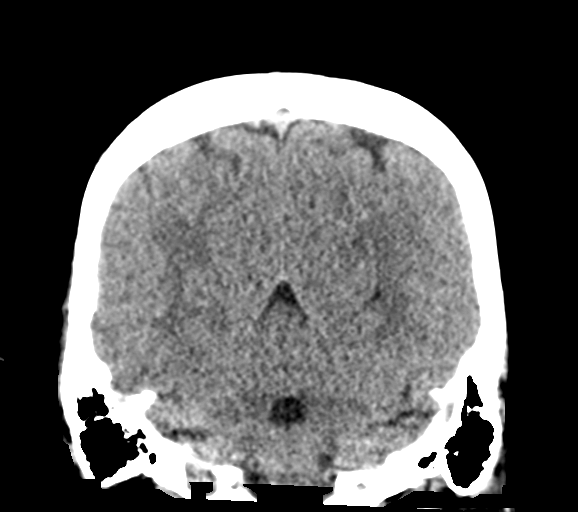
[im 29/66  brain]
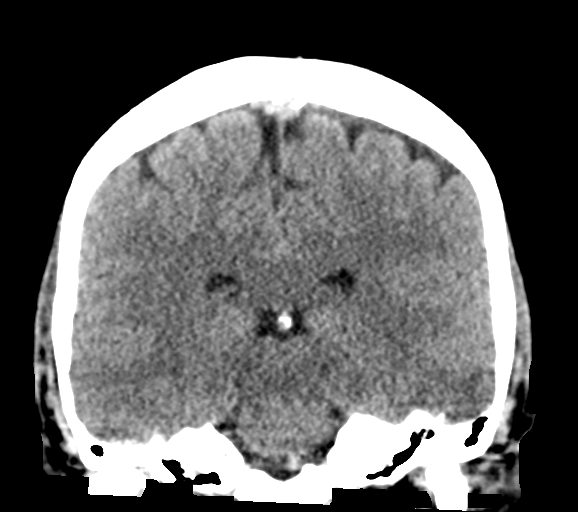
[im 37/66  brain]
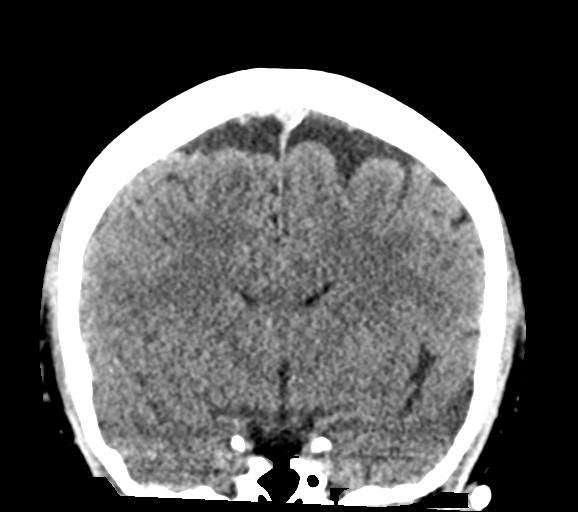

[Series 5: sagittal soft tissue · sagittal · 0.29mm/px · 3 of 55 slices shown]
[im 19/55  brain]
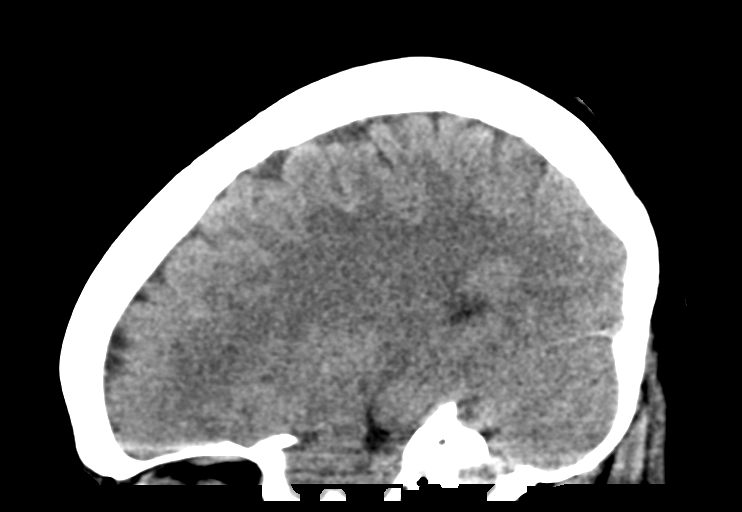
[im 28/55  brain]
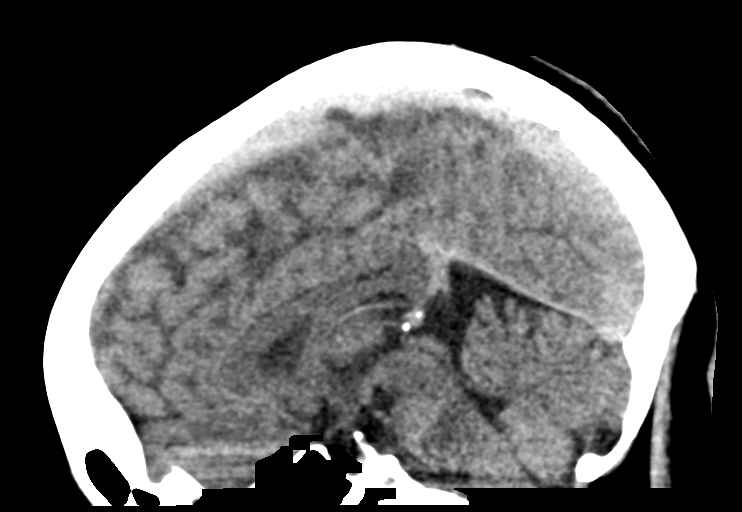
[im 37/55  brain]
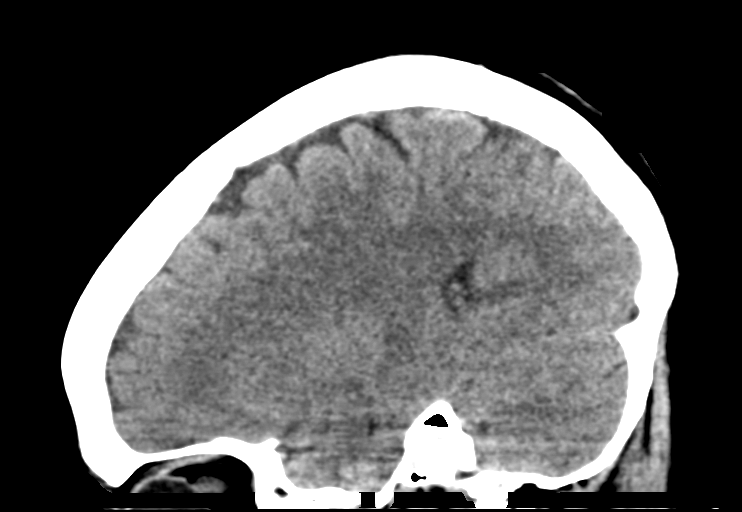

[15 of 47 positions shown; findings below may reference images not displayed]

FINDINGS: Brain: The ventricles are normal in size and configuration. There is
no intracranial mass, hemorrhage, extra-axial fluid collection, or
midline shift. The brain parenchyma appears unremarkable. There is
no evident acute infarct.

Vascular: There is no hyperdense vessel. There is no evident
vascular calcification.

Skull: The bony calvarium appears intact.

Sinuses/Orbits: Visualized paranasal sinuses are clear. Visualized
orbits appear symmetric bilaterally.

Other: Mastoid air cells are clear.
IMPRESSION: Study within normal limits.
